# Patient Record
Sex: Female | Born: 1970 | Race: Black or African American | Hispanic: No | Marital: Single | Smoking: Current every day smoker
Health system: Southern US, Community
[De-identification: ages and names within clinical notes are randomized; demographics above are authoritative.]

## PROBLEM LIST (undated history)

## (undated) DIAGNOSIS — J40 Bronchitis, not specified as acute or chronic: Secondary | ICD-10-CM

## (undated) HISTORY — PX: ABDOMINAL HYSTERECTOMY: SHX81

---

## 2008-07-16 ENCOUNTER — Emergency Department: Payer: Self-pay | Admitting: Emergency Medicine

## 2008-11-17 ENCOUNTER — Emergency Department: Payer: Self-pay | Admitting: Emergency Medicine

## 2009-01-22 ENCOUNTER — Ambulatory Visit: Payer: Self-pay | Admitting: Internal Medicine

## 2009-01-26 ENCOUNTER — Ambulatory Visit: Payer: Self-pay | Admitting: Internal Medicine

## 2010-01-19 ENCOUNTER — Ambulatory Visit: Payer: Self-pay | Admitting: Internal Medicine

## 2010-01-28 ENCOUNTER — Emergency Department: Payer: Self-pay | Admitting: Emergency Medicine

## 2010-02-08 ENCOUNTER — Ambulatory Visit: Payer: Self-pay | Admitting: Internal Medicine

## 2010-02-08 ENCOUNTER — Ambulatory Visit: Payer: Self-pay | Admitting: Anesthesiology

## 2010-02-22 ENCOUNTER — Ambulatory Visit: Payer: Self-pay | Admitting: Internal Medicine

## 2010-04-08 HISTORY — PX: BREAST BIOPSY: SHX20

## 2010-04-19 ENCOUNTER — Ambulatory Visit: Payer: Self-pay | Admitting: Surgery

## 2010-11-04 ENCOUNTER — Ambulatory Visit: Payer: Self-pay | Admitting: Surgery

## 2010-12-02 ENCOUNTER — Emergency Department: Payer: Self-pay | Admitting: Emergency Medicine

## 2011-04-30 ENCOUNTER — Emergency Department: Payer: Self-pay | Admitting: Emergency Medicine

## 2011-09-01 ENCOUNTER — Emergency Department: Payer: Self-pay | Admitting: Emergency Medicine

## 2011-12-06 ENCOUNTER — Ambulatory Visit: Payer: Self-pay | Admitting: Obstetrics and Gynecology

## 2011-12-06 LAB — COMPREHENSIVE METABOLIC PANEL
Anion Gap: 8 (ref 7–16)
Bilirubin,Total: 0.3 mg/dL (ref 0.2–1.0)
Chloride: 109 mmol/L — ABNORMAL HIGH (ref 98–107)
Co2: 21 mmol/L (ref 21–32)
Creatinine: 0.83 mg/dL (ref 0.60–1.30)
EGFR (African American): >60
Osmolality: 274 (ref 275–301)
Potassium: 3.9 mmol/L (ref 3.5–5.1)
SGOT(AST): 28 U/L (ref 15–37)
SGPT (ALT): 99 U/L — ABNORMAL HIGH
Total Protein: 8.2 g/dL (ref 6.4–8.2)

## 2011-12-06 LAB — CBC
HCT: 36.8 % (ref 35.0–47.0)
HGB: 12.2 g/dL (ref 12.0–16.0)
MCV: 79 fL — ABNORMAL LOW (ref 80–100)
RBC: 4.64 10*6/uL (ref 3.80–5.20)
WBC: 6.9 10*3/uL (ref 3.6–11.0)

## 2011-12-06 LAB — PREGNANCY, URINE: Pregnancy Test, Urine: NEGATIVE m[IU]/mL

## 2011-12-08 ENCOUNTER — Ambulatory Visit: Payer: Self-pay | Admitting: Obstetrics and Gynecology

## 2011-12-08 LAB — CBC
HCT: 32.4 % — ABNORMAL LOW (ref 35.0–47.0)
HGB: 10.7 g/dL — ABNORMAL LOW (ref 12.0–16.0)
MCH: 25.9 pg — ABNORMAL LOW (ref 26.0–34.0)
RBC: 4.14 10*6/uL (ref 3.80–5.20)
RDW: 14.3 % (ref 11.5–14.5)

## 2011-12-08 LAB — COMPREHENSIVE METABOLIC PANEL
Anion Gap: 8 (ref 7–16)
BUN: 9 mg/dL (ref 7–18)
Bilirubin,Total: 0.4 mg/dL (ref 0.2–1.0)
Calcium, Total: 8.1 mg/dL — ABNORMAL LOW (ref 8.5–10.1)
Chloride: 106 mmol/L (ref 98–107)
Co2: 26 mmol/L (ref 21–32)
EGFR (African American): >60
EGFR (Non-African Amer.): >60
Glucose: 128 mg/dL — ABNORMAL HIGH (ref 65–99)
Osmolality: 280 (ref 275–301)
Potassium: 4.3 mmol/L (ref 3.5–5.1)
SGOT(AST): 24 U/L (ref 15–37)
SGPT (ALT): 54 U/L
Sodium: 140 mmol/L (ref 136–145)
Total Protein: 6.9 g/dL (ref 6.4–8.2)

## 2011-12-09 LAB — HEMATOCRIT: HCT: 30.5 % — ABNORMAL LOW (ref 35.0–47.0)

## 2011-12-12 LAB — PATHOLOGY REPORT

## 2011-12-29 ENCOUNTER — Emergency Department: Payer: Self-pay | Admitting: Emergency Medicine

## 2012-01-06 ENCOUNTER — Ambulatory Visit: Payer: Self-pay

## 2012-02-08 IMAGING — CR CERVICAL SPINE - COMPLETE 4+ VIEW
1 series · 7 of 7 positions shown · non-contrast
Comparison: none

REASON FOR EXAM: bilateral hand numbness/tingling neck pain
COMMENTS:

[Series 1: view not recorded · 0.17mm/px · 7 of 7 slices shown]
[im 1/7]
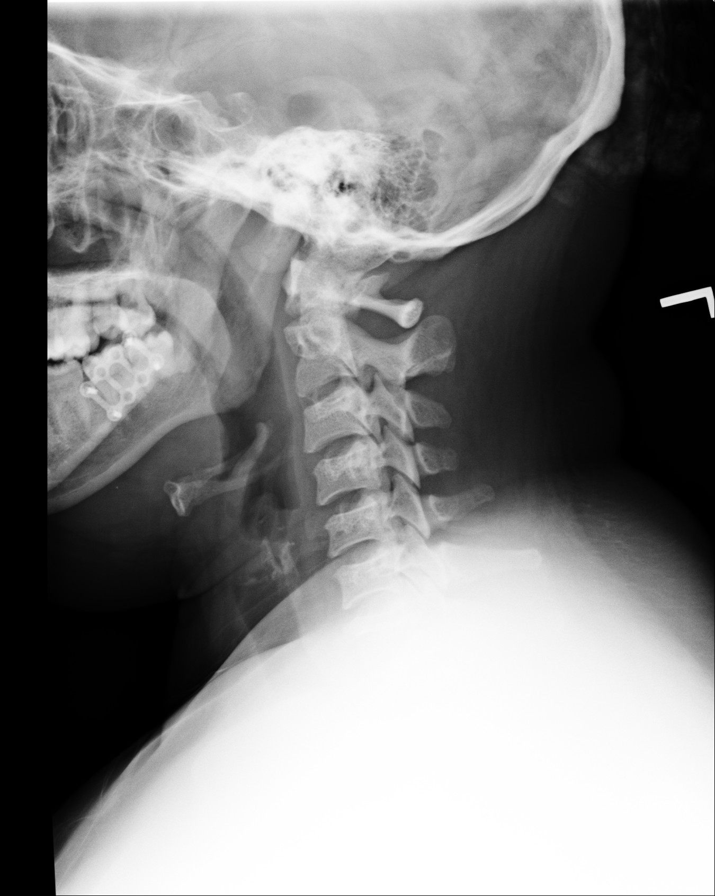
[im 2/7]
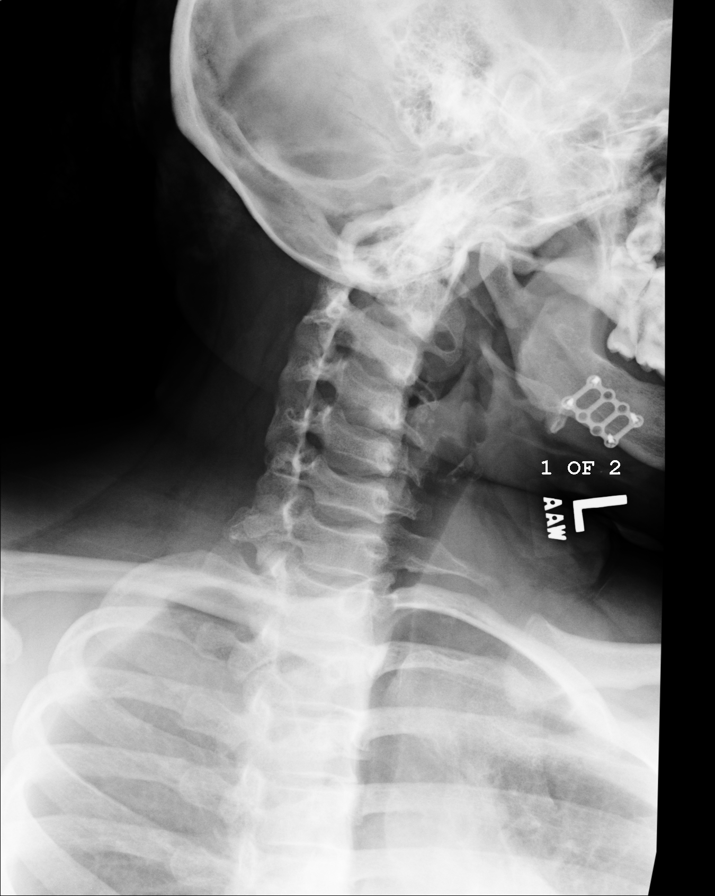
[im 3/7]
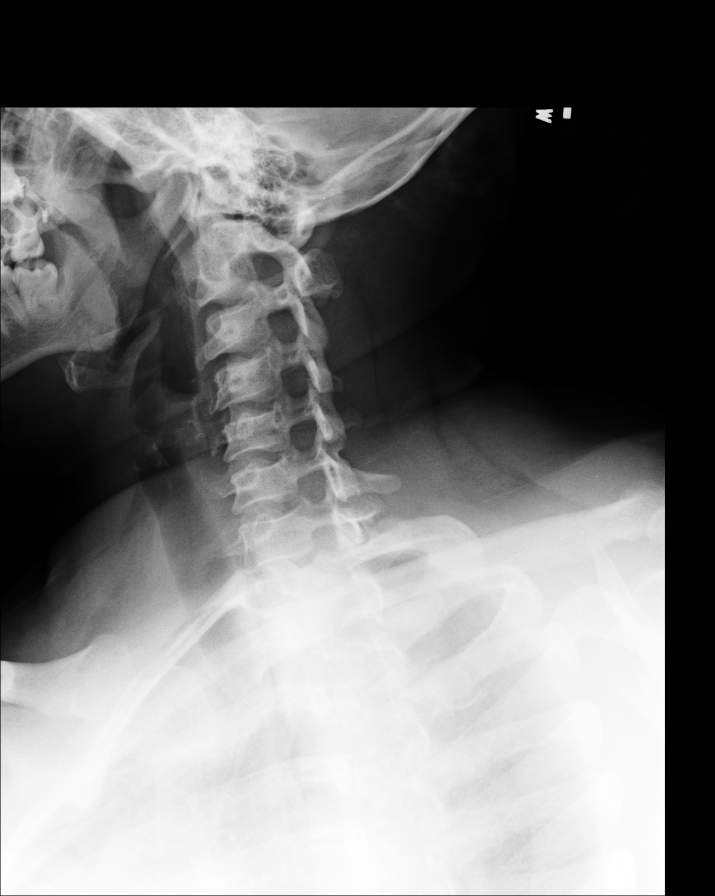
[im 4/7]
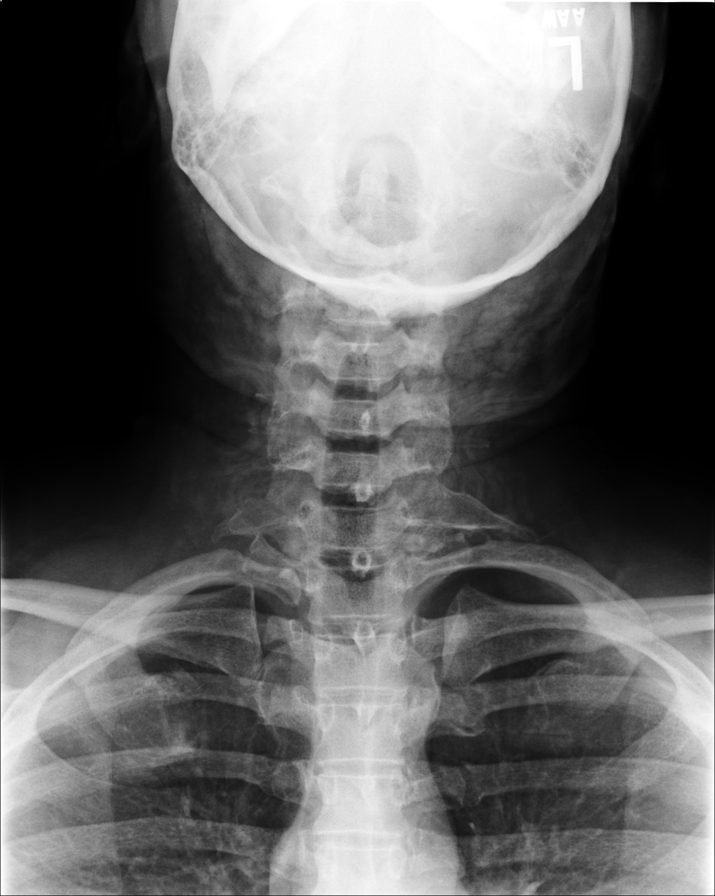
[im 5/7]
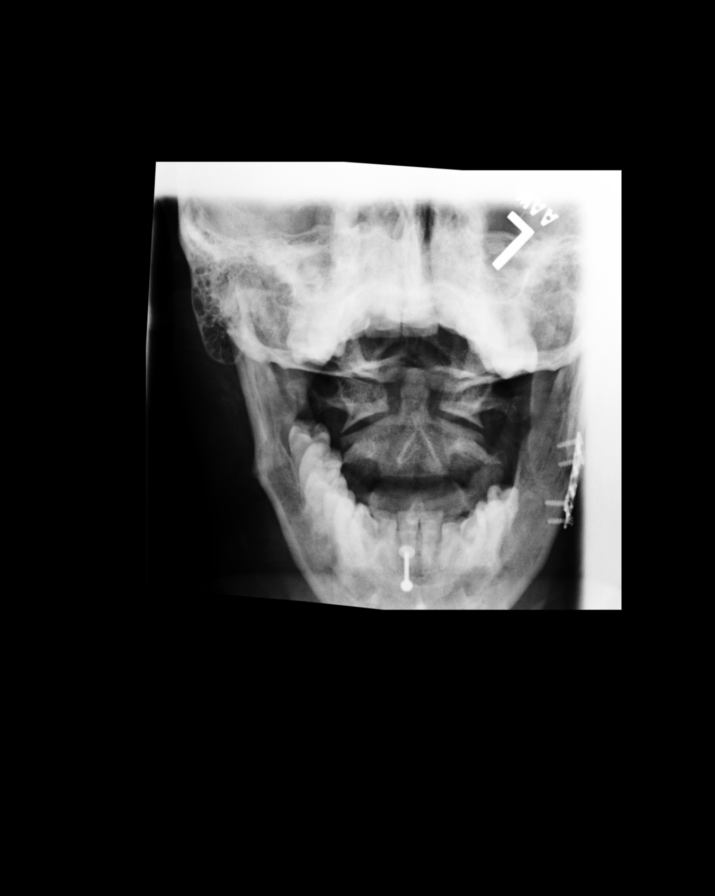
[im 6/7]
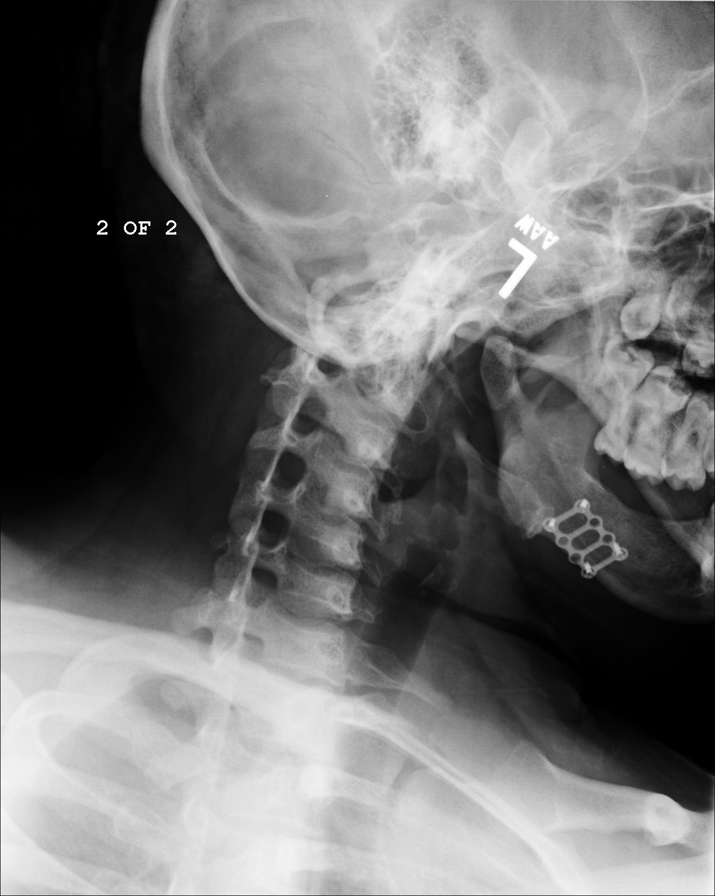
[im 7/7]
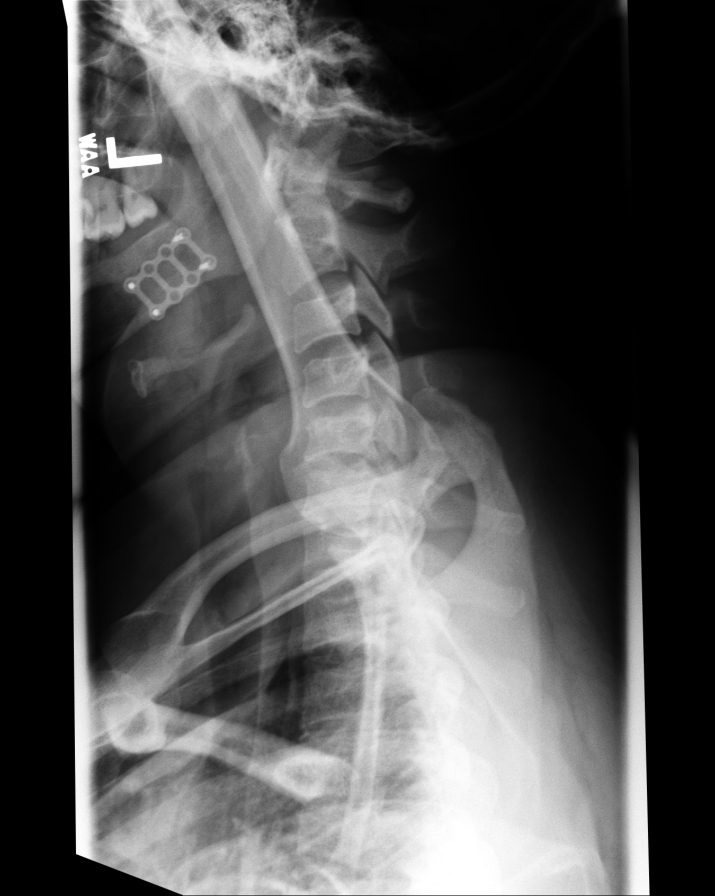

[7 of 7 positions shown; findings below may reference images not displayed]

PROCEDURE:     DXR - DXR CERVICAL SPINE COMPLETE  - January 19, 2010  [DATE]

RESULT:     The cervicothoracic region is poorly seen on the lateral view. A
swimmer's view shows grossly normal alignment. Previous mandible surgery is
present on the left with a plate and multiple screws. There is slight
tilting of the head toward the left. The foramina appear to be free of bony
encroachment. There is straightening of the normal cervical lordosis. The
prevertebral soft tissues appear normal.
IMPRESSION: 1.     There does not appear to be significant degenerative change.
2.      Straightening of the normal cervical lordosis could be secondary to
muscle spasm.

## 2012-08-28 ENCOUNTER — Emergency Department: Payer: Self-pay | Admitting: Emergency Medicine

## 2013-01-08 ENCOUNTER — Ambulatory Visit: Payer: Self-pay

## 2013-02-26 ENCOUNTER — Emergency Department: Payer: Self-pay | Admitting: Emergency Medicine

## 2013-03-08 ENCOUNTER — Emergency Department: Payer: Self-pay | Admitting: Emergency Medicine

## 2013-03-08 LAB — BASIC METABOLIC PANEL
Anion Gap: 4 — ABNORMAL LOW (ref 7–16)
BUN: 10 mg/dL (ref 7–18)
Calcium, Total: 9.2 mg/dL (ref 8.5–10.1)
Chloride: 107 mmol/L (ref 98–107)
Co2: 27 mmol/L (ref 21–32)
EGFR (African American): >60
EGFR (Non-African Amer.): >60
Glucose: 146 mg/dL — ABNORMAL HIGH (ref 65–99)
Osmolality: 277 (ref 275–301)
Potassium: 3.6 mmol/L (ref 3.5–5.1)

## 2013-03-08 LAB — CBC
HCT: 40 % (ref 35.0–47.0)
MCH: 26.6 pg (ref 26.0–34.0)
MCV: 81 fL (ref 80–100)
RBC: 4.96 10*6/uL (ref 3.80–5.20)
RDW: 14.4 % (ref 11.5–14.5)
WBC: 8.4 10*3/uL (ref 3.6–11.0)

## 2013-04-09 ENCOUNTER — Emergency Department: Payer: Self-pay | Admitting: Emergency Medicine

## 2013-09-11 ENCOUNTER — Ambulatory Visit: Payer: Self-pay | Admitting: Family Medicine

## 2014-01-09 ENCOUNTER — Ambulatory Visit: Payer: Self-pay | Admitting: Family Medicine

## 2014-02-06 ENCOUNTER — Ambulatory Visit: Payer: Self-pay | Admitting: Family Medicine

## 2014-07-31 ENCOUNTER — Emergency Department: Payer: Self-pay | Admitting: Emergency Medicine

## 2014-07-31 LAB — BASIC METABOLIC PANEL
ANION GAP: 10 (ref 7–16)
BUN: 19 mg/dL
CREATININE: 0.83 mg/dL
Calcium, Total: 8.5 mg/dL — ABNORMAL LOW
Chloride: 105 mmol/L
Co2: 23 mmol/L
EGFR (African American): >60
GLUCOSE: 98 mg/dL
Potassium: 4 mmol/L
Sodium: 138 mmol/L

## 2014-07-31 LAB — CBC
HCT: 41.5 % (ref 35.0–47.0)
HGB: 13.3 g/dL (ref 12.0–16.0)
MCH: 26 pg (ref 26.0–34.0)
MCHC: 32.1 g/dL (ref 32.0–36.0)
MCV: 81 fL (ref 80–100)
Platelet: 297 10*3/uL (ref 150–440)
RBC: 5.12 10*6/uL (ref 3.80–5.20)
RDW: 15 % — ABNORMAL HIGH (ref 11.5–14.5)
WBC: 11.3 10*3/uL — ABNORMAL HIGH (ref 3.6–11.0)

## 2014-07-31 LAB — TROPONIN I

## 2014-07-31 LAB — PRO B NATRIURETIC PEPTIDE: B-Type Natriuretic Peptide: 16 pg/mL

## 2014-08-26 NOTE — Op Note (Signed)
PATIENT NAME:  Gabriella Coleman, Gabriella Coleman MR#:  [id number] DATE OF BIRTH:  1970-10-25  DATE OF PROCEDURE:  12/08/2011  PREOPERATIVE DIAGNOSES:  1. Abnormal uterine bleeding.  2. Leiomyomatous uterus.   POSTOPERATIVE DIAGNOSES: 1. Abnormal uterine bleeding.  2. Leiomyomatous uterus.   PROCEDURES:  1. Total laparoscopic hysterectomy.  2. Bilateral salpingectomy.  3. Cystoscopy.   SURGEON: Will Bonnet, MD   ASSISTANT SURGEON: Malachy Mood, MD    ESTIMATED BLOOD LOSS: 200 mL.  OPERATIVE FLUIDS: 2400 mL Crystalloid.   COMPLICATIONS: None.   FINDINGS:  1. Large fibroid uterus with ovaries densely adherent to the uterus, fallopian tubes, and bilateral sidewalls.  2. Several ovarian simple cysts in the right ovary.  3. Bilateral efflux from the ureters on cystoscopy.   SPECIMENS SENT: Uterus with cervix along with right tube and left tube to pathology for permanent.   INDICATIONS FOR PROCEDURE: Ms. Camus is a 44 year old female who was seen in the office for heavy painful bleeding. Upon work-up it was noted that she had a large fibroid which was thought to be submucosal as well as several other fibroids. She was offered different modalities of potential treatment for this, however, ultimately she wanted to undergo hysterectomy for management. Also after discussing the merits versus risks and benefits of removal of fallopian tubes, she elected to have her fallopian tubes removed as part of the surgery. She was, therefore, taken to the operating room.   PROCEDURE IN DETAIL: The patient was met in the preoperative area and the procedure was reviewed with her in detail and all of her questions were answered. She was taken to the operating room and placed under general anesthesia which was found to be adequate. She was placed in the dorsal supine lithotomy position.  After careful attention was paid to positioning to ensure no nerve damage would occur during surgery she was prepped and draped in the  usual sterile fashion. After time-out was called, a Foley catheter was placed in her bladder and a sterile speculum was placed in her vagina. A single-tooth tenaculum was used to grasp the anterior lip of her cervix. After sounding her uterus to 9 cm, a V-care device was then attached to her cervix with the balloon inflated and the cervical ring flush against the entire fornix area.   Attention was turned to the abdomen where a 5 mm infraumbilical incision was made after injecting local anesthesia. Entry into the abdomen was gained using the Optiview trocar system. Abdominal entry was verified using the opening pressure which was found to be normal. After insufflation with CO2, the camera was introduced through the umbilical port site and atraumatic entry was verified. Under direct intraabdominal camera visualization in the right lower quadrant, a 10 mm port was placed and a left lower quadrant 5 mm port was placed. An abdominal survey was undertaken with the above-noted findings.   Attention was turned to the right adnexal area where initially the right ureter was identified and found to be well away from the operative area of interest. The round ligament was then identified and transected using the Harmonic scalpel and entry into the retroperitoneal space was gained. However, there was a large amount of adhesive tissue and the broad ligament was able to be opened anteriorly and the beginning of the bladder flap was created. Next, the fallopian tube was then removed independently starting from the medial position to lateral. Adhesions were then carefully removed on the right adnexa and the uteroovarian ligament was identified and transected  with electrocautery. This was followed down the right broad ligament until the uterine artery could be found and skeletonized. Next, the same procedure was carried out on the left side after identification of the left ureter with removal of the left fallopian tube first from  medial to lateral fashion. Again, careful dissection had to be undertaken in order to safely remove the scar tissue and adhesions from the ovary to the left side of the uterus. The broad ligament was transected down to the level of the uterine artery which was skeletonized and ultimately the uterine artery was skeletonized bilaterally.   After electrocauterization of both uterine arteries was adequate, the bladder flap was then carefully created and the colpotomy was made using the Harmonic scalpel with careful attention to applying pressure on the cervical fornix rings of the V-care device in order to move the colpotomy away from the path of the ureters. Colpotomy was accomplished and the entire cervix was detached from the vagina in this fashion. Given the size of the uterus versus the patient's vaginal opening, it was decided that an intraabdominal morcellation procedure would be needed to remove the uterus from the abdominal cavity. The uterus was then moved to the upper abdominal cavity for closure of the vaginal cuff.   Vaginal cuff was closed using #0 Vicryl using interrupted stitches. The integrity of the vaginal closure was verified using a gloved hand in the vagina pushing against the vaginal cuff. There were no openings nor leakage of air. The vaginal cuff closure was affected using the EndoStitch device for the stitches and extracorporeal knot tying with knot pusher to push down the knots. Next, the right lower quadrant port was replaced with the Gynecare morcellator port. The uterus was then removed from the abdominal cavity carefully using the morcellator with careful attention to remove any visible remaining pieces of uterus and cervix. Next, careful lavage was undertaken of the abdomen to further identify uterine pieces as well as to verify hemostasis. Uterine pieces were removed and hemostasis was verified. The right lower quadrant port was then removed and the right lower quadrant fascia was  closed using a fascial closure device and the fascia was reapproximated using a 0 vicryl stitch. The skin was closed using 4-0 Monocryl in a subcuticular fashion.   Cystoscopy was next undertaken to verify function of bilateral ureters. Indigo carmine was injected by the anesthesiologist. The 70 degree cystoscope was gently introduced through the urethra after removal of Foley catheter. The bladder was then filled with approximately 150 mL of normal sterile saline. The dome of the bladder was identified with no defects as well as the trigone and lateral walls. The bladder was noted to be totally intact. Both ureteral orifices were easily identified and bilateral efflux was noted of indigo carmine from the ureteral orifices. At the end of the procedure the Foley catheter was then replaced.   Attention was returned to the abdomen where each port site was carefully removed after desufflation of abdomen of CO2. A single 4-0 Monocryl stitch was placed in each of the 5 mm port sites and the skin was reapproximated at all three port sites using Dermabond.   The patient tolerated the procedure well. Sponge, lap, and needle counts were correct x2. The patient received antibiotic prophylaxis in the form of Ancef 2 grams prior to the procedure and during the procedure as the procedure took about four hours to accomplish. She received VTE prophylaxis in the form of SCDs which were in place and  operating throughout the entire procedure.    The patient was awakened in the operating room and transported to the recovery area in stable condition.   ____________________________ Will Bonnet, MD sdj:drc D: 12/08/2011 18:28:40 ET T: 12/09/2011 10:25:49 ET JOB#: [id number]  cc: Will Bonnet, MD, <Dictator> Will Bonnet MD ELECTRONICALLY SIGNED 12/15/2011 7:58

## 2014-09-23 ENCOUNTER — Encounter: Payer: Self-pay | Admitting: Emergency Medicine

## 2014-09-23 ENCOUNTER — Emergency Department: Payer: Medicaid Other

## 2014-09-23 ENCOUNTER — Emergency Department
Admission: EM | Admit: 2014-09-23 | Discharge: 2014-09-23 | Disposition: A | Discharge status: Home / Self Care | Payer: Medicaid Other | Attending: Emergency Medicine | Admitting: Emergency Medicine

## 2014-09-23 DIAGNOSIS — Z720 Tobacco use: Secondary | ICD-10-CM | POA: Insufficient documentation

## 2014-09-23 DIAGNOSIS — J209 Acute bronchitis, unspecified: Secondary | ICD-10-CM | POA: Insufficient documentation

## 2014-09-23 DIAGNOSIS — R0789 Other chest pain: Secondary | ICD-10-CM | POA: Insufficient documentation

## 2014-09-23 DIAGNOSIS — J029 Acute pharyngitis, unspecified: Secondary | ICD-10-CM | POA: Diagnosis present

## 2014-09-23 HISTORY — DX: Bronchitis, not specified as acute or chronic: J40

## 2014-09-23 MED ORDER — PROMETHAZINE-CODEINE 6.25-10 MG/5ML PO SYRP: 10.0000 mL | ORAL_SOLUTION | Freq: Three times a day (TID) | ORAL | Status: DC | PRN

## 2014-09-23 MED ORDER — HYDROCOD POLST-CPM POLST ER 10-8 MG/5ML PO SUER
5.0000 mL | Freq: Once | ORAL | Status: AC
  Administered 2014-09-23: 5 mL via ORAL

## 2014-09-23 MED ORDER — PREDNISONE 20 MG PO TABS
ORAL_TABLET | ORAL | Status: AC
  Filled 2014-09-23: qty 3

## 2014-09-23 MED ORDER — AZITHROMYCIN 250 MG PO TABS: ORAL_TABLET | ORAL | Status: DC

## 2014-09-23 MED ORDER — PREDNISONE 20 MG PO TABS
60.0000 mg | ORAL_TABLET | Freq: Once | ORAL | Status: AC
  Administered 2014-09-23: 60 mg via ORAL

## 2014-09-23 MED ORDER — ALBUTEROL SULFATE HFA 108 (90 BASE) MCG/ACT IN AERS: 2.0000 | INHALATION_SPRAY | Freq: Four times a day (QID) | RESPIRATORY_TRACT | Status: DC | PRN

## 2014-09-23 MED ORDER — PREDNISONE 10 MG PO TABS: 50.0000 mg | ORAL_TABLET | Freq: Every day | ORAL | Status: DC

## 2014-09-23 MED ORDER — AZITHROMYCIN 250 MG PO TABS
ORAL_TABLET | ORAL | Status: AC
  Administered 2014-09-23: 500 mg via ORAL
  Filled 2014-09-23: qty 2

## 2014-09-23 MED ORDER — IPRATROPIUM-ALBUTEROL 0.5-2.5 (3) MG/3ML IN SOLN
3.0000 mL | Freq: Once | RESPIRATORY_TRACT | Status: AC
  Administered 2014-09-23: 3 mL via RESPIRATORY_TRACT

## 2014-09-23 MED ORDER — PROMETHAZINE-CODEINE 6.25-10 MG/5ML PO SYRP: 5.0000 mL | ORAL_SOLUTION | Freq: Four times a day (QID) | ORAL | Status: DC | PRN

## 2014-09-23 MED ORDER — HYDROCOD POLST-CPM POLST ER 10-8 MG/5ML PO SUER
ORAL | Status: AC
  Filled 2014-09-23: qty 5

## 2014-09-23 MED ORDER — IPRATROPIUM-ALBUTEROL 0.5-2.5 (3) MG/3ML IN SOLN
RESPIRATORY_TRACT | Status: AC
  Administered 2014-09-23: 3 mL via RESPIRATORY_TRACT
  Filled 2014-09-23: qty 3

## 2014-09-23 MED ORDER — AZITHROMYCIN 250 MG PO TABS
500.0000 mg | ORAL_TABLET | Freq: Once | ORAL | Status: AC
  Administered 2014-09-23: 500 mg via ORAL

## 2014-09-23 NOTE — ED Notes (Signed)
Pt to ed with c/o sore throat, cough, congestion, earache since Thursday.  Reports hx of bronchitis, +smoker.

## 2014-09-23 NOTE — ED Provider Notes (Signed)
[facility] Emergency Department Provider Note  ____________________________________________  Time seen: Approximately 8:38 PM  I have reviewed the triage vital signs and the nursing notes.   HISTORY  Chief Complaint Cough; Nasal Congestion; and Sore Throat    HPI Gabriella Coleman is a 44 y.o. female who presents with about a seven-Urenda history of sinus pain and cough which has progressed to sore throat. No relief with over-the-counter medications. She is a smoker but has not had any cigarettes in the past 3 days. Her cough is much worse at night when trying to lie down and sleep.   Past Medical History  Diagnosis Date  . Bronchitis     There are no active problems to display for this patient.   Past Surgical History  Procedure Laterality Date  . Abdominal hysterectomy      Current Outpatient Rx  Name  Route  Sig  Dispense  Refill  . albuterol (PROVENTIL HFA;VENTOLIN HFA) 108 (90 BASE) MCG/ACT inhaler   Inhalation   Inhale 2 puffs into the lungs every 6 (six) hours as needed for wheezing or shortness of breath.   1 Inhaler   2   . azithromycin (ZITHROMAX Z-PAK) 250 MG tablet      Take 2 tablets (500 mg) on  [place] 1,  followed by 1 tablet (250 mg) once daily on Days 2 through 5.   6 each   0   . predniSONE (DELTASONE) 10 MG tablet   Oral   Take 5 tablets (50 mg total) by mouth daily.   25 tablet   0   . promethazine-codeine (PHENERGAN WITH CODEINE) 6.25-10 MG/5ML syrup   Oral   Take 10 mLs by mouth every 8 (eight) hours as needed for cough.   120 mL   0     Allergies Percocet  No family history on file.  Social History History  Substance Use Topics  . Smoking status: Current [employer] [place] Smoker  . Smokeless tobacco: Not on file  . Alcohol Use: Yes    Review of Systems Constitutional: No fever/chills Eyes: No visual changes. ENT:  sore throat and sinus pain. Cardiovascular: Denies chest pain. Respiratory: Denies shortness of  breath. Gastrointestinal: No abdominal pain.  No nausea, no vomiting.  No diarrhea.  No constipation. Genitourinary: Negative for dysuria. Musculoskeletal: Negative for back pain. Chest is sore due to cough. Skin: Negative for rash. Neurological: Negative for headaches, focal weakness or numbness.  10-point ROS otherwise negative.  ____________________________________________   PHYSICAL EXAM:  VITAL SIGNS: ED Triage Vitals  Enc Vitals Group     BP 09/23/14 1738 141/88 mmHg     Pulse Rate 09/23/14 1738 83     Resp 09/23/14 1738 20     Temp 09/23/14 1738 98.1 F (36.7 C)     Temp Source 09/23/14 1738 Oral     SpO2 09/23/14 1738 100 %     Weight 09/23/14 1738 287 lb (130.182 kg)     Height 09/23/14 1738 5\' 6"  (1.676 m)     Head Cir --      Peak Flow --      Pain Score 09/23/14 1739 9     Pain Loc --      Pain Edu? --      Excl. in GC? --     Constitutional: Alert and oriented. Well appearing and in no acute distress. Frequent dry cough observed. Eyes: Conjunctivae are normal. PERRL. EOMI. Head: Atraumatic. Nose: Maxillary and ethmoid sinus tenderness  bilateral with mucosal edema.. Mouth/Throat: Mucous membranes are moist.  Oropharynx mildly erythematous Neck: No stridor.  Hematological/Lymphatic/Immunilogical: No cervical lymphadenopathy. Cardiovascular: Normal rate, regular rhythm. Grossly normal heart sounds.  Good peripheral circulation. Respiratory: Normal respiratory effort.  No retractions. Decreased breath sounds throughout. Scant expiratory wheezes bilateral bases. Gastrointestinal: Soft and nontender. . Musculoskeletal: No lower extremity tenderness nor edema.  No joint effusions. Chest wall tender to palpation Neurologic:  Normal speech and language. No gross focal neurologic deficits are appreciated. Speech is normal. No gait instability. Skin:  Skin is warm, dry and intact. No rash noted. Psychiatric: Mood and affect are normal. Speech and behavior are  normal.  ____________________________________________   LABS (all labs ordered are listed, but only abnormal results are displayed)  Labs Reviewed - No data to display ____________________________________________  EKG   ____________________________________________  RADIOLOGY  Chest x-ray negative for infiltrate or opacity. ____________________________________________   PROCEDURES  Procedure(s) performed: None  Critical Care performed: No  ____________________________________________   INITIAL IMPRESSION / ASSESSMENT AND PLAN / ED COURSE  Pertinent labs & imaging results that were available during my care of the patient were reviewed by me and considered in my medical decision making (see chart for details).  Some improvement post DuoNeb treatment in the emergency department. She'll be discharged home on prednisone and azithromycin cough medicine and an albuterol inhaler. She was advised to follow-up with a primary care provider of her choice for symptoms that are not improving over the next 2-3 days or return to the emergency department for symptoms that change or worsen. ____________________________________________   FINAL CLINICAL IMPRESSION(S) / ED DIAGNOSES  Final diagnoses:  Bronchitis, acute, with bronchospasm      Chinita PesterCari B Frederika Hukill, FNP 09/23/14 [id number]  Phineas SemenGraydon Goodman, MD 09/23/14 2240

## 2014-09-23 NOTE — ED Notes (Signed)
Pt here with cough and congestion, pt states that she has bronchitis, pt has a hx of the same. Pt states that she has a productive cough. Pt states that she has had fever, chills. Pt states that she is having chest pain from the cough. Pt in NAD at this time will cont to monitor pt at all times.

## 2014-09-23 NOTE — Discharge Instructions (Signed)

## 2014-09-23 NOTE — ED Notes (Signed)
Called pharmacy to bring the medication up.

## 2016-01-18 ENCOUNTER — Encounter: Payer: Self-pay | Admitting: Emergency Medicine

## 2016-01-18 ENCOUNTER — Emergency Department: Payer: Medicaid Other

## 2016-01-18 ENCOUNTER — Emergency Department
Admission: EM | Admit: 2016-01-18 | Discharge: 2016-01-19 | Disposition: A | Discharge status: Home / Self Care | Payer: Medicaid Other | Attending: Emergency Medicine | Admitting: Emergency Medicine

## 2016-01-18 DIAGNOSIS — I6602 Occlusion and stenosis of left middle cerebral artery: Secondary | ICD-10-CM | POA: Insufficient documentation

## 2016-01-18 DIAGNOSIS — F17200 Nicotine dependence, unspecified, uncomplicated: Secondary | ICD-10-CM | POA: Insufficient documentation

## 2016-01-18 DIAGNOSIS — R42 Dizziness and giddiness: Secondary | ICD-10-CM | POA: Diagnosis not present

## 2016-01-18 LAB — BASIC METABOLIC PANEL
ANION GAP: 8 (ref 5–15)
BUN: 11 mg/dL (ref 6–20)
CO2: 23 mmol/L (ref 22–32)
Calcium: 9.1 mg/dL (ref 8.9–10.3)
Chloride: 105 mmol/L (ref 101–111)
Creatinine, Ser: 0.88 mg/dL (ref 0.44–1.00)
GFR calc non Af Amer: >60 mL/min (ref >60)
Glucose, Bld: 99 mg/dL (ref 65–99)
Potassium: 3.7 mmol/L (ref 3.5–5.1)
Sodium: 136 mmol/L (ref 135–145)

## 2016-01-18 LAB — CBC
HCT: 42.3 % (ref 35.0–47.0)
HEMOGLOBIN: 14.4 g/dL (ref 12.0–16.0)
MCH: 26.5 pg (ref 26.0–34.0)
MCHC: 33.9 g/dL (ref 32.0–36.0)
MCV: 78.1 fL — ABNORMAL LOW (ref 80.0–100.0)
Platelets: 287 10*3/uL (ref 150–440)
RBC: 5.42 MIL/uL — AB (ref 3.80–5.20)
RDW: 14.8 % — ABNORMAL HIGH (ref 11.5–14.5)
WBC: 7.6 10*3/uL (ref 3.6–11.0)

## 2016-01-18 LAB — URINALYSIS COMPLETE WITH MICROSCOPIC (ARMC ONLY)
Bacteria, UA: NONE SEEN
Bilirubin Urine: NEGATIVE
Glucose, UA: NEGATIVE mg/dL
KETONES UR: NEGATIVE mg/dL
Leukocytes, UA: NEGATIVE
Nitrite: NEGATIVE
PH: 5 (ref 5.0–8.0)
PROTEIN: 30 mg/dL — AB
SPECIFIC GRAVITY, URINE: 1.018 (ref 1.005–1.030)

## 2016-01-18 LAB — GLUCOSE, CAPILLARY: Glucose-Capillary: 95 mg/dL (ref 65–99)

## 2016-01-18 MED ORDER — MECLIZINE HCL 25 MG PO TABS
25.0000 mg | ORAL_TABLET | Freq: Once | ORAL | Status: AC
  Administered 2016-01-18: 25 mg via ORAL
  Filled 2016-01-18: qty 1

## 2016-01-18 NOTE — ED Provider Notes (Signed)
[facility] Emergency Department Provider Note    First MD Initiated Contact with Patient 01/18/16 1105     (approximate)  I have reviewed the triage vital signs and the nursing notes.   HISTORY  Chief Complaint Dizziness   HPI Gabriella Coleman is a 45 y.o. female presents with three-Krohn history of dizziness left-sided headache with left facial numbness. Patient also admits to unsteady "bouncing" vision intermittently as well. Patient denies any recent illness, no congestion cough or fever. Patient does admit to frequent sneezing over the past few days. Patient denies any personal family history of aneurysms.   Past Medical History:  Diagnosis Date  . Bronchitis     There are no active problems to display for this patient.   Past Surgical History:  Procedure Laterality Date  . ABDOMINAL HYSTERECTOMY      Prior to Admission medications   Medication Sig Start Date End Date Taking? Authorizing Provider  albuterol (PROVENTIL HFA;VENTOLIN HFA) 108 (90 BASE) MCG/ACT inhaler Inhale 2 puffs into the lungs every 6 (six) hours as needed for wheezing or shortness of breath. 09/23/14   Chinita Pester, FNP  azithromycin (ZITHROMAX Z-PAK) 250 MG tablet Take 2 tablets (500 mg) on  [place] 1,  followed by 1 tablet (250 mg) once daily on Days 2 through 5. 09/23/14   Cari B Triplett, FNP  meclizine (ANTIVERT) 32 MG tablet Take 1 tablet (32 mg total) by mouth 3 (three) times daily as needed. 01/19/16   Darci Current, MD  predniSONE (DELTASONE) 10 MG tablet Take 5 tablets (50 mg total) by mouth daily. 09/23/14   Chinita Pester, FNP  promethazine-codeine (PHENERGAN WITH CODEINE) 6.25-10 MG/5ML syrup Take 10 mLs by mouth every 8 (eight) hours as needed for cough. 09/23/14   Chinita Pester, FNP    Allergies Percocet [oxycodone-acetaminophen]  No family history on file.  Social History Social History  Substance Use Topics  . Smoking status: Current Every Butters Smoker  .  Smokeless tobacco: Never Used  . Alcohol use Yes    Review of Systems Constitutional: No fever/chills Eyes: No visual changes. ENT: No sore throat. Cardiovascular: Denies chest pain. Respiratory: Denies shortness of breath. Gastrointestinal: No abdominal pain.  No nausea, no vomiting.  No diarrhea.  No constipation. Genitourinary: Negative for dysuria. Musculoskeletal: Negative for back pain. Skin: Negative for rash. Neurological: Negative for headaches, focal weakness or numbness. Positive for dizziness  10-point ROS otherwise negative.  ____________________________________________   PHYSICAL EXAM:  VITAL SIGNS: ED Triage Vitals  Enc Vitals Group     BP 01/18/16 2148 (!) 143/80     Pulse Rate 01/18/16 2148 64     Resp 01/18/16 2148 18     Temp 01/18/16 2148 97.7 F (36.5 C)     Temp Source 01/18/16 2148 Oral     SpO2 01/18/16 2148 98 %     Weight 01/18/16 2148 230 lb (104.3 kg)     Height 01/18/16 2148 5\' 6"  (1.676 m)     Head Circumference --      Peak Flow --      Pain Score 01/18/16 2149 8     Pain Loc --      Pain Edu? --      Excl. in GC? --     Constitutional: Alert and oriented. Well appearing and in no acute distress. Eyes: Conjunctivae are normal. PERRL. EOMI.Positive for horizontal nystagmus Head: Atraumatic. Ears:  Healthy appearing ear canals and TMs bilaterally  Nose: No congestion/rhinnorhea. Mouth/Throat: Mucous membranes are moist.  Oropharynx non-erythematous. Neck: No stridor.  No meningeal signs. Cardiovascular: Normal rate, regular rhythm. Good peripheral circulation. Grossly normal heart sounds. Respiratory: Normal respiratory effort.  No retractions. Lungs CTAB. Gastrointestinal: Soft and nontender. No distention.  Musculoskeletal: No lower extremity tenderness nor edema. No gross deformities of extremities. Neurologic:  Normal speech and language. No gross focal neurologic deficits are appreciated.  Skin:  Skin is warm, dry and intact. No  rash noted. Psychiatric: Mood and affect are normal. Speech and behavior are normal.  ____________________________________________   LABS (all labs ordered are listed, but only abnormal results are displayed)  Labs Reviewed  CBC - Abnormal; Notable for the following:       Result Value   RBC 5.42 (*)    MCV 78.1 (*)    RDW 14.8 (*)    All other components within normal limits  URINALYSIS COMPLETEWITH MICROSCOPIC ([facility] ONLY) - Abnormal; Notable for the following:    Color, Urine YELLOW (*)    APPearance CLEAR (*)    Hgb urine dipstick 2+ (*)    Protein, ur 30 (*)    Squamous Epithelial / LPF 0-5 (*)    All other components within normal limits  BASIC METABOLIC PANEL  GLUCOSE, CAPILLARY  CBG MONITORING, ED   ____________________________________________  EKG  ED ECG REPORT I, Cherry N BROWN, the attending physician, personally viewed and interpreted this ECG.   Date: 01/18/2016  EKG Time: 9:56 PM  Rate: 56  Rhythm: Sinus bradycardia  Axis:Normal  Intervals:Normal  ST&T Change: None  ____________________________________________  RADIOLOGY I, [place] N BROWN, personally viewed and evaluated these images (plain radiographs) as part of my medical decision making, as well as reviewing the written report by the radiologist.  Mr Angiogram Head Wo Contrast  Result Date: 01/19/2016 CLINICAL DATA:  Dizziness, nystagmus, nausea EXAM: MRI HEAD WITHOUT CONTRAST MRA HEAD WITHOUT CONTRAST TECHNIQUE: Multiplanar, multiecho pulse sequences of the brain and surrounding structures were obtained without intravenous contrast. Angiographic images of the head were obtained using MRA technique without contrast. COMPARISON:  Head CT 11/18/2008 FINDINGS: MRI HEAD FINDINGS Brain: No acute infarct or intraparenchymal hemorrhage. The midline structures are normal. No focal parenchymal signal abnormality. No mass lesion or midline shift. No hydrocephalus or extra-axial fluid collection. Vascular:  Major intracranial flow voids are preserved. No evidence of chronic microhemorrhage or amyloid angiopathy. Skull and upper cervical spine: The visualized skull base, calvarium, upper cervical spine and extracranial soft tissues are normal. Sinuses/Orbits: No fluid levels or advanced mucosal thickening. No mastoid effusion. Normal orbits. MRA HEAD FINDINGS Intracranial internal carotid arteries: Normal. Anterior cerebral arteries: Normal. Middle cerebral arteries: There is mild narrowing of the inferior division M2 segment on the left. Normal right MCA. Posterior communicating arteries: Present bilaterally. Posterior cerebral arteries: Normal. Fetal predominant on the right. Basilar artery: Normal. Vertebral arteries: Left dominant. Normal. Superior cerebellar arteries: Normal. Anterior inferior cerebellar arteries: Normal. Posterior inferior cerebellar arteries: Normal. IMPRESSION: 1. Normal MRI of the brain. 2. No occlusion or high-grade stenosis of the intracranial arteries. Electronically Signed   By: Deatra Robinson M.D.   On: 01/19/2016 01:37   Mr Brain Wo Contrast  Result Date: 01/19/2016 CLINICAL DATA:  Dizziness, nystagmus, nausea EXAM: MRI HEAD WITHOUT CONTRAST MRA HEAD WITHOUT CONTRAST TECHNIQUE: Multiplanar, multiecho pulse sequences of the brain and surrounding structures were obtained without intravenous contrast. Angiographic images of the head were obtained using MRA technique without contrast. COMPARISON:  Head CT 11/18/2008 FINDINGS:  MRI HEAD FINDINGS Brain: No acute infarct or intraparenchymal hemorrhage. The midline structures are normal. No focal parenchymal signal abnormality. No mass lesion or midline shift. No hydrocephalus or extra-axial fluid collection. Vascular: Major intracranial flow voids are preserved. No evidence of chronic microhemorrhage or amyloid angiopathy. Skull and upper cervical spine: The visualized skull base, calvarium, upper cervical spine and extracranial soft tissues  are normal. Sinuses/Orbits: No fluid levels or advanced mucosal thickening. No mastoid effusion. Normal orbits. MRA HEAD FINDINGS Intracranial internal carotid arteries: Normal. Anterior cerebral arteries: Normal. Middle cerebral arteries: There is mild narrowing of the inferior division M2 segment on the left. Normal right MCA. Posterior communicating arteries: Present bilaterally. Posterior cerebral arteries: Normal. Fetal predominant on the right. Basilar artery: Normal. Vertebral arteries: Left dominant. Normal. Superior cerebellar arteries: Normal. Anterior inferior cerebellar arteries: Normal. Posterior inferior cerebellar arteries: Normal. IMPRESSION: 1. Normal MRI of the brain. 2. No occlusion or high-grade stenosis of the intracranial arteries. Electronically Signed   By: Deatra RobinsonKevin  Herman M.D.   On: 01/19/2016 01:37    ____________________________________________   PROCEDURES  Procedure(s) performed:   Procedures     INITIAL IMPRESSION / ASSESSMENT AND PLAN / ED COURSE  Pertinent labs & imaging results that were available during my care of the patient were reviewed by me and considered in my medical decision making (see chart for details).  Patient given meclizine with improvement of symptoms MRI revealed "normal brain". Patient referred to ENT   Clinical Course    ____________________________________________  FINAL CLINICAL IMPRESSION(S) / ED DIAGNOSES  Final diagnoses:  Vertigo     MEDICATIONS GIVEN DURING THIS VISIT:  Medications  meclizine (ANTIVERT) tablet 25 mg (25 mg Oral Given 01/18/16 2324)  LORazepam (ATIVAN) tablet 1 mg (1 mg Oral Given 01/19/16 0015)  ondansetron (ZOFRAN) tablet 4 mg (4 mg Oral Given 01/19/16 0030)     NEW OUTPATIENT MEDICATIONS STARTED DURING THIS VISIT:  New Prescriptions   MECLIZINE (ANTIVERT) 32 MG TABLET    Take 1 tablet (32 mg total) by mouth 3 (three) times daily as needed.    Modified Medications   No medications on file     Discontinued Medications   No medications on file     Note:  This document was prepared using Dragon voice recognition software and may include unintentional dictation errors.    Darci Currentandolph N Brown, MD 01/19/16 [phone]

## 2016-01-18 NOTE — ED Triage Notes (Signed)
Pt presents to ED with c/o dizziness and left sided head pressure x 3 days, reports this morning began to have tingling sensation to left side of face. Pt denise chest pain, abdominal pain, pr shortness of breath. Pt alert and oriented x 4, no increased work in breathing noted, skin warm and dry.

## 2016-01-19 MED ORDER — LORAZEPAM 1 MG PO TABS
ORAL_TABLET | ORAL | Status: AC
  Administered 2016-01-19: 1 mg via ORAL
  Filled 2016-01-19: qty 1

## 2016-01-19 MED ORDER — MECLIZINE HCL 32 MG PO TABS: 32.0000 mg | ORAL_TABLET | Freq: Three times a day (TID) | ORAL | 0 refills | Status: DC | PRN

## 2016-01-19 MED ORDER — LORAZEPAM 1 MG PO TABS
1.0000 mg | ORAL_TABLET | Freq: Once | ORAL | Status: AC
  Administered 2016-01-19: 1 mg via ORAL

## 2016-01-19 MED ORDER — ONDANSETRON HCL 4 MG PO TABS: 8.0000 mg | ORAL_TABLET | Freq: Once | ORAL | Status: DC

## 2016-01-19 MED ORDER — ONDANSETRON HCL 4 MG PO TABS
4.0000 mg | ORAL_TABLET | Freq: Once | ORAL | Status: AC
  Administered 2016-01-19: 4 mg via ORAL

## 2016-01-19 MED ORDER — ONDANSETRON HCL 4 MG PO TABS
ORAL_TABLET | ORAL | Status: AC
  Filled 2016-01-19: qty 2

## 2016-01-19 NOTE — ED Notes (Signed)

## 2016-01-19 NOTE — ED Notes (Signed)
MD at bedside. 

## 2016-01-19 NOTE — ED Notes (Signed)
Pt transported to MRI by Allayne StackJann, EDT.

## 2016-09-12 ENCOUNTER — Emergency Department: Payer: Medicaid Other

## 2016-09-12 ENCOUNTER — Emergency Department
Admission: EM | Admit: 2016-09-12 | Discharge: 2016-09-12 | Disposition: A | Discharge status: Home / Self Care | Payer: Medicaid Other | Attending: Emergency Medicine | Admitting: Emergency Medicine

## 2016-09-12 ENCOUNTER — Encounter: Payer: Self-pay | Admitting: Emergency Medicine

## 2016-09-12 DIAGNOSIS — F17200 Nicotine dependence, unspecified, uncomplicated: Secondary | ICD-10-CM | POA: Insufficient documentation

## 2016-09-12 DIAGNOSIS — J209 Acute bronchitis, unspecified: Secondary | ICD-10-CM | POA: Diagnosis not present

## 2016-09-12 DIAGNOSIS — Z79899 Other long term (current) drug therapy: Secondary | ICD-10-CM | POA: Insufficient documentation

## 2016-09-12 DIAGNOSIS — R05 Cough: Secondary | ICD-10-CM | POA: Diagnosis present

## 2016-09-12 MED ORDER — ALBUTEROL SULFATE (2.5 MG/3ML) 0.083% IN NEBU
INHALATION_SOLUTION | RESPIRATORY_TRACT | Status: AC
  Administered 2016-09-12: 5 mg via RESPIRATORY_TRACT
  Filled 2016-09-12: qty 3

## 2016-09-12 MED ORDER — ALBUTEROL SULFATE (2.5 MG/3ML) 0.083% IN NEBU
5.0000 mg | INHALATION_SOLUTION | Freq: Once | RESPIRATORY_TRACT | Status: AC
  Administered 2016-09-12: 5 mg via RESPIRATORY_TRACT
  Filled 2016-09-12: qty 6

## 2016-09-12 MED ORDER — MECLIZINE HCL 32 MG PO TABS: 32.0000 mg | ORAL_TABLET | Freq: Three times a day (TID) | ORAL | 0 refills | Status: AC | PRN

## 2016-09-12 MED ORDER — PREDNISONE 10 MG (21) PO TBPK: ORAL_TABLET | Freq: Every day | ORAL | 0 refills | Status: AC

## 2016-09-12 MED ORDER — AZITHROMYCIN 250 MG PO TABS: ORAL_TABLET | ORAL | 0 refills | Status: AC

## 2016-09-12 NOTE — ED Provider Notes (Signed)
[facility] Emergency Department Provider Note  ____________________________________________  Time seen: Approximately 3:49 PM  I have reviewed the triage vital signs and the nursing notes.   HISTORY  Chief Complaint Cough and Bronchitis    HPI Gabriella Coleman is a 46 y.o. female presenting to the emergency department with productive cough for yellow and green sputum, shortness of breath and vertigo for approximately 3 days. Patient denies rhinorrhea, malaise and myalgias. Patient has been afebrile. She denies associated chest pain, chest tightness, nausea, vomiting or abdominal pain. Patient states that she has a history of chronic vertigo treated with meclizine. Patient states that she has tried DayQuil and Robitussin which has not relieved her symptoms.   Past Medical History:  Diagnosis Date  . Bronchitis     There are no active problems to display for this patient.   Past Surgical History:  Procedure Laterality Date  . ABDOMINAL HYSTERECTOMY      Prior to Admission medications   Medication Sig Start Date End Date Taking? Authorizing Provider  albuterol (PROVENTIL HFA;VENTOLIN HFA) 108 (90 BASE) MCG/ACT inhaler Inhale 2 puffs into the lungs every 6 (six) hours as needed for wheezing or shortness of breath. 09/23/14   Triplett, Rulon Eisenmengerari B, FNP  azithromycin (ZITHROMAX Z-PAK) 250 MG tablet Take 2 tablets (500 mg) on  [name] 1,  followed by 1 tablet (250 mg) once daily on Days 2 through 5. 09/12/16 09/17/16  Orvil FeilWoods, Taejah Ohalloran M, PA-C  meclizine (ANTIVERT) 32 MG tablet Take 1 tablet (32 mg total) by mouth 3 (three) times daily as needed. 09/12/16 09/22/16  Orvil FeilWoods, Vanessa Alesi M, PA-C  predniSONE (STERAPRED UNI-PAK 21 TAB) 10 MG (21) TBPK tablet Take by mouth daily. Take 6 tablets the first Espaillat, take 5 tablets the second Lindamood, take 4 tablets the third Sheridan, take 3 tablets the fourth Peart, take 2 tablets the fifth Baumert, take 1 tablet the sixth Stanzione. 09/12/16 09/18/16  Orvil FeilWoods, Kwane Rohl M, PA-C   promethazine-codeine (PHENERGAN WITH CODEINE) 6.25-10 MG/5ML syrup Take 10 mLs by mouth every 8 (eight) hours as needed for cough. 09/23/14   Chinita Pesterriplett, Cari B, FNP    Allergies Percocet [oxycodone-acetaminophen]  History reviewed. No pertinent family history.  Social History Social History  Substance Use Topics  . Smoking status: Current Every Beg[place] Smoker  . Smokeless tobacco: Never Used  . Alcohol use Yes     Review of Systems  Constitutional: No fever/chills Eyes: No visual changes. No discharge ENT: No upper respiratory complaints. Cardiovascular: no chest pain. Respiratory: Patient has productive cough.  Gastrointestinal: No abdominal pain.  No nausea, no vomiting.  No diarrhea.  No constipation. Musculoskeletal: Negative for musculoskeletal pain. Skin: Negative for rash, abrasions, lacerations, ecchymosis. Neurological: Patient has chronic vertigo.    ____________________________________________   PHYSICAL EXAM:  VITAL SIGNS: ED Triage Vitals  Enc Vitals Group     BP 09/12/16 1339 137/74     Pulse Rate 09/12/16 1339 82     Resp 09/12/16 1339 20     Temp 09/12/16 1339 97.8 F (36.6 C)     Temp Source 09/12/16 1339 Oral     SpO2 09/12/16 1339 97 %     Weight 09/12/16 1340 240 lb (108.9 kg)     Height 09/12/16 1340 5\' 6"  (1.676 m)     Head Circumference --      Peak Flow --      Pain Score --      Pain Loc --      Pain  Edu? --      Excl. in GC? --      Constitutional: Alert and oriented. Well appearing and in no acute distress. Eyes: Conjunctivae are normal. PERRL. EOMI. Head: Atraumatic. ENT:      Ears: Tympanic membranes are pearly bilaterally.      Nose: No congestion/rhinnorhea.      Mouth/Throat: Mucous membranes are moist. } Hematological/Lymphatic/Immunilogical: No cervical lymphadenopathy.* Cardiovascular: Normal rate, regular rhythm. Normal S1 and S2.  Good peripheral circulation. Respiratory: Normal respiratory effort without tachypnea or  retractions. Lungs CTAB. Good air entry to the bases with no decreased or absent breath sounds. Musculoskeletal: Full range of motion to all extremities. No gross deformities appreciated. Neurologic:  Normal speech and language. No gross focal neurologic deficits are appreciated.  Skin:  Skin is warm, dry and intact. No rash noted. Psychiatric: Mood and affect are normal. Speech and behavior are normal. Patient exhibits appropriate insight and judgement.   ____________________________________________   LABS (all labs ordered are listed, but only abnormal results are displayed)  Labs Reviewed - No data to display ____________________________________________  EKG   ____________________________________________  RADIOLOGY Geraldo Pitter, personally viewed and evaluated these images (plain radiographs) as part of my medical decision making, as well as reviewing the written report by the radiologist.  Dg Chest 2 View  Result Date: 09/12/2016 CLINICAL DATA:  Cough.  Possible bronchitis. EXAM: CHEST  2 VIEW COMPARISON:  09/23/2014 FINDINGS: The cardiomediastinal silhouette is within normal limits. There is new peribronchial thickening. No confluent airspace opacity, pleural effusion, or pneumothorax is identified. No acute osseous abnormality is seen. IMPRESSION: Airway thickening suggesting bronchitis. Electronically Signed   By: Sebastian Ache M.D.   On: 09/12/2016 14:31    ____________________________________________    PROCEDURES  Procedure(s) performed:    Procedures    Medications  albuterol (PROVENTIL) (2.5 MG/3ML) 0.083% nebulizer solution 5 mg (5 mg Nebulization Given 09/12/16 1353)     ____________________________________________   INITIAL IMPRESSION / ASSESSMENT AND PLAN / ED COURSE  Pertinent labs & imaging results that were available during my care of the patient were reviewed by me and considered in my medical decision making (see chart for details).  Review  of the [place] CSRS was performed in accordance of the [facility] prior to dispensing any controlled drugs.     Assessment and plan: Acute Bronchitis Vertigo Patient presents to the emergency department with cough for purulent sputum production and intermittent shortness of breath. DG chest revealed findings consistent with bronchitis. Physical exam and vital signs are reassuring. Patient was discharged with azithromycin and tapered prednisone. Patient was discharged with meclizine for chronic vertigo. She was advised to follow-up with her primary care provider in one week. All patient questions were answered.   ____________________________________________  FINAL CLINICAL IMPRESSION(S) / ED DIAGNOSES  Final diagnoses:  Acute bronchitis, unspecified organism      NEW MEDICATIONS STARTED DURING THIS VISIT:  New Prescriptions   AZITHROMYCIN (ZITHROMAX Z-PAK) 250 MG TABLET    Take 2 tablets (500 mg) on  Alf 1,  followed by 1 tablet (250 mg) once daily on Days 2 through 5.   MECLIZINE (ANTIVERT) 32 MG TABLET    Take 1 tablet (32 mg total) by mouth 3 (three) times daily as needed.   PREDNISONE (STERAPRED UNI-PAK 21 TAB) 10 MG (21) TBPK TABLET    Take by mouth daily. Take 6 tablets the first Janik, take 5 tablets the second Divis, take 4 tablets the third Sikora, take 3  tablets the fourth Pacer, take 2 tablets the fifth Lampley, take 1 tablet the sixth Limehouse.        This chart was dictated using voice recognition software/Dragon. Despite best efforts to proofread, errors can occur which can change the meaning. Any change was purely unintentional.    Orvil Feil, PA-C 09/12/16 1559    Rockne Menghini, MD 09/12/16 [phone]

## 2016-09-12 NOTE — ED Triage Notes (Signed)
Pt presents to ED c/o bronchitis with raspy productive cough at night x3 days. Pt has taken dayquil , and robitussin DM without relief.

## 2016-09-26 ENCOUNTER — Other Ambulatory Visit: Payer: Self-pay | Admitting: Family Medicine

## 2016-09-26 DIAGNOSIS — Z1231 Encounter for screening mammogram for malignant neoplasm of breast: Secondary | ICD-10-CM

## 2016-10-27 ENCOUNTER — Ambulatory Visit
Admission: RE | Admit: 2016-10-27 | Discharge: 2016-10-27 | Disposition: A | Discharge status: Home / Self Care | Payer: Medicaid Other | Source: Ambulatory Visit | Attending: Family Medicine | Admitting: Family Medicine

## 2016-10-27 DIAGNOSIS — Z1231 Encounter for screening mammogram for malignant neoplasm of breast: Secondary | ICD-10-CM | POA: Diagnosis present

## 2017-11-08 ENCOUNTER — Other Ambulatory Visit: Payer: Self-pay | Admitting: Family Medicine

## 2017-11-08 DIAGNOSIS — Z1231 Encounter for screening mammogram for malignant neoplasm of breast: Secondary | ICD-10-CM

## 2017-12-19 ENCOUNTER — Ambulatory Visit
Admission: RE | Admit: 2017-12-19 | Discharge: 2017-12-19 | Disposition: A | Discharge status: Home / Self Care | Payer: Medicaid Other | Source: Ambulatory Visit | Attending: Family Medicine | Admitting: Family Medicine

## 2017-12-19 DIAGNOSIS — Z1231 Encounter for screening mammogram for malignant neoplasm of breast: Secondary | ICD-10-CM | POA: Insufficient documentation

## 2020-03-31 ENCOUNTER — Other Ambulatory Visit: Payer: Self-pay | Admitting: Physician Assistant

## 2020-03-31 DIAGNOSIS — Z1231 Encounter for screening mammogram for malignant neoplasm of breast: Secondary | ICD-10-CM

## 2021-02-19 ENCOUNTER — Emergency Department
Admission: EM | Admit: 2021-02-19 | Discharge: 2021-02-19 | Disposition: A | Discharge status: Home / Self Care | Payer: Medicaid Other | Attending: Emergency Medicine | Admitting: Emergency Medicine

## 2021-02-19 ENCOUNTER — Other Ambulatory Visit: Payer: Self-pay

## 2021-02-19 DIAGNOSIS — W1839XA Other fall on same level, initial encounter: Secondary | ICD-10-CM | POA: Diagnosis not present

## 2021-02-19 DIAGNOSIS — S161XXA Strain of muscle, fascia and tendon at neck level, initial encounter: Secondary | ICD-10-CM | POA: Diagnosis not present

## 2021-02-19 DIAGNOSIS — F17200 Nicotine dependence, unspecified, uncomplicated: Secondary | ICD-10-CM | POA: Diagnosis not present

## 2021-02-19 DIAGNOSIS — W19XXXA Unspecified fall, initial encounter: Secondary | ICD-10-CM

## 2021-02-19 DIAGNOSIS — S199XXA Unspecified injury of neck, initial encounter: Secondary | ICD-10-CM | POA: Diagnosis present

## 2021-02-19 MED ORDER — LIDOCAINE 5 % EX PTCH
1.0000 | MEDICATED_PATCH | CUTANEOUS | Status: DC
  Administered 2021-02-19: 1 via TRANSDERMAL
  Filled 2021-02-19: qty 1

## 2021-02-19 MED ORDER — CYCLOBENZAPRINE HCL 5 MG PO TABS: 5.0000 mg | ORAL_TABLET | Freq: Three times a day (TID) | ORAL | 0 refills | Status: AC | PRN

## 2021-02-19 MED ORDER — KETOROLAC TROMETHAMINE 30 MG/ML IJ SOLN
30.0000 mg | Freq: Once | INTRAMUSCULAR | Status: AC
  Administered 2021-02-19: 30 mg via INTRAMUSCULAR
  Filled 2021-02-19: qty 1

## 2021-02-19 MED ORDER — LIDOCAINE 5 % EX PTCH: 1.0000 | MEDICATED_PATCH | Freq: Two times a day (BID) | CUTANEOUS | 0 refills | Status: AC

## 2021-02-19 NOTE — ED Triage Notes (Signed)
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 States she has talking to beatrice who is the [employer]

## 2021-02-19 NOTE — Discharge Instructions (Signed)
Take your anti-inflammatory for your arthritis, Tylenol 1 g every 8 hours and use the lidocaine patches and the Flexeril to help with muscle spasms.  The Flexeril can make you tired and sometimes dizzy so try it first taking it at nighttime.  Do not drive or work while on this

## 2021-02-19 NOTE — ED Provider Notes (Signed)
[facility] Emergency Department Provider Note  ____________________________________________   Event Date/Time   First MD Initiated Contact with Patient 02/19/21 1026     (approximate)  I have reviewed the triage vital signs and the nursing notes.   HISTORY  Chief Complaint Fall    HPI Gabriella Coleman is a 50 y.o. 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  Patient states that she did hit the side of her head but denies any hematoma or significant pain to that area.  She states that initially it did hurt a tiny bit but she put some ice on it and has not gone gone away.  She denies any confusion, vomiting, being on any blood thinners.  She denies any LOC.  She states that she feels at her baseline self.  She denies any midline neck tenderness.  She reports having pain on the right side of her neck going down into her shoulder.  The pain is worse when she tries to move her neck but she is able to still have full range of motion.  She denies any numbness or tingling in her hands.  Reports a clicking sensation in her left thumb but denies any tenderness.  She took some Tylenol and is already on an anti-inflammatory for baseline arthritis.  Denies any chest wall tenderness, abdominal tenderness or other injuries she has been ambulatory since it happened.          Past Medical History:  Diagnosis Date   Bronchitis     There are no problems to display for this patient.   Past Surgical History:  Procedure Laterality Date   ABDOMINAL HYSTERECTOMY     BREAST BIOPSY Left 04/2010   neg- Tophat clip    Prior to Admission medications   Medication Sig Start Date End Date Taking? Authorizing Provider  albuterol (PROVENTIL HFA;VENTOLIN HFA) 108 (90 BASE) MCG/ACT inhaler Inhale 2 puffs into the lungs every 6 (six) hours as needed for wheezing or shortness of breath. 09/23/14   Triplett, Cari B, FNP   promethazine-codeine (PHENERGAN WITH CODEINE) 6.25-10 MG/5ML syrup Take 10 mLs by mouth every 8 (eight) hours as needed for cough. 09/23/14   Chinita Pester, FNP    Allergies Percocet [oxycodone-acetaminophen]  Family History  Problem Relation Age of Onset   Breast cancer Neg Hx     Social History Social History   Tobacco Use   Smoking status: Every Ask   Smokeless tobacco: Never  Substance Use Topics   Alcohol use: Yes   Drug use: No      Review of Systems Constitutional: No fever/chills Eyes: No visual changes. ENT: No sore throat.  Hit her head Cardiovascular: Denies chest pain. Respiratory: Denies shortness of breath. Gastrointestinal: No abdominal pain.  No nausea, no vomiting.  No diarrhea.  No constipation. Genitourinary: Negative for dysuria. Musculoskeletal: Right neck pain, left thumb clicking Skin: Negative for rash. Neurological: Negative for headaches, focal weakness or numbness. All other ROS negative ____________________________________________   PHYSICAL EXAM:  VITAL SIGNS: ED Triage Vitals  Enc Vitals Group     BP 02/19/21 1001 (!) 148/94     Pulse Rate 02/19/21 1001 73     Resp 02/19/21 1001 18     Temp 02/19/21 1001 97.9 F (36.6 C)     Temp Source 02/19/21 1001 Oral     SpO2 02/19/21 1001 98 %  Weight 02/19/21 1002 240 lb (108.9 kg)     Height 02/19/21 1002 5\' 6"  (1.676 m)     Head Circumference --      Peak Flow --      Pain Score 02/19/21 1001 10     Pain Loc --      Pain Edu? --      Excl. in GC? --     Constitutional: Alert and oriented. Well appearing and in no acute distress. Eyes: Conjunctivae are normal. EOMI. Head: Atraumatic.  No battle sign, raccoon eyes, TMs clear bilaterally Nose: No congestion/rhinnorhea. Mouth/Throat: Mucous membranes are moist.   Neck: No stridor. Trachea Midline. FROM Cardiovascular: Normal rate, regular rhythm. Grossly normal heart sounds.  Good peripheral circulation.  No chest wall  tenderness Respiratory: Normal respiratory effort.  No retractions. Lungs CTAB. Gastrointestinal: Soft and nontender. No distention. No abdominal bruits.  Musculoskeletal: No lower extremity tenderness nor edema.  No joint effusions.  No snuffbox tenderness.  Sensation intact.  2+ distal pulse in the hand.  No bruising.  When she goes to extend her left thumb there is a clicking noise.  She still able to range it fully. Neurologic:  Normal speech and language. No gross focal neurologic deficits are appreciated.  Skin:  Skin is warm, dry and intact. No rash noted. Psychiatric: Mood and affect are normal. Speech and behavior are normal. GU: Deferred  Tenderness along the right side of her neck about 2-3 inches away from midline down into her shoulder .  No midline C-spine tenderness.  Full range of motion of neck.  No numbness or tingling in the hands ____________________________________________  INITIAL IMPRESSION / ASSESSMENT AND PLAN / ED COURSE  Gabriella Coleman was evaluated in Emergency Department on 02/19/2021 for the symptoms described in the history of present illness. She was evaluated in the context of the global COVID-19 pandemic, which necessitated consideration that the patient might be at risk for infection with the SARS-CoV-2 virus that causes COVID-19. Institutional protocols and algorithms that pertain to the evaluation of patients at risk for COVID-19 are in a state of rapid change based on information released by regulatory bodies including the CDC and federal and state organizations. These policies and algorithms were followed during the patient's care in the ED.     Patient comes in with a mechanical fall that happened yesterday.  Patient is very well-appearing with reassuring vital signs.  Her biggest concern is some right sided neck pain going down into her shoulder.  Patient did hit her head but this occurred yesterday.  Her getting CT head is completely negative and I discussed this  with patient and she is comfortable holding off on CT imaging but will return if she develops any worsening symptoms.  Patient has no midline neck tenderness and per Nexus criteria she also does not need any CT imaging of her neck.  Her pain runs along the trapezius muscle.  She got no numbness or tingling, full range of motion of neck and no midline tenderness to suggest needing CT imaging.  For her left thumb clicking I suspect that this is some kind of tendon issue.  She is not tender to suggest a scaphoid fracture.  Offered x-ray but patient declined stating that she has follow-up with Dr. 02/21/2021 next week and she will just talk to him about it.  We discussed anti-inflammatories which she is already on, Tylenol, lidocaine patches and some Flexeril to help at nighttime but that it can cause some  drowsiness.  Patient does not go back to work until Monday so we will write her out until Tuesday so that she can follow-up with a primary care doctor on Monday given she does not work today, tomorrow, Sunday.        ____________________________________________   FINAL CLINICAL IMPRESSION(S) / ED DIAGNOSES   Final diagnoses:  Fall, initial encounter  Neck strain, initial encounter      MEDICATIONS GIVEN DURING THIS VISIT:  Medications  lidocaine (LIDODERM) 5 % 1 patch (1 patch Transdermal Patch Applied 02/19/21 1041)  ketorolac (TORADOL) 30 MG/ML injection 30 mg (30 mg Intramuscular Given 02/19/21 1041)     ED Discharge Orders          Ordered    lidocaine (LIDODERM) 5 %  Every 12 hours        02/19/21 1045    cyclobenzaprine (FLEXERIL) 5 MG tablet  3 times daily PRN        10 /14/22 1045             Note:  This document was prepared using Dragon voice recognition software and may include unintentional dictation errors.    05-11-1972, MD 02/19/21 1046

## 2021-02-26 ENCOUNTER — Other Ambulatory Visit: Payer: Self-pay | Admitting: Family Medicine

## 2021-02-26 ENCOUNTER — Ambulatory Visit
Admission: RE | Admit: 2021-02-26 | Discharge: 2021-02-26 | Disposition: A | Discharge status: Home / Self Care | Payer: Self-pay | Source: Ambulatory Visit | Attending: Family Medicine | Admitting: Family Medicine

## 2021-02-26 DIAGNOSIS — W19XXXA Unspecified fall, initial encounter: Secondary | ICD-10-CM

## 2021-03-02 ENCOUNTER — Other Ambulatory Visit: Payer: Self-pay | Admitting: Physician Assistant

## 2021-03-02 DIAGNOSIS — Z1231 Encounter for screening mammogram for malignant neoplasm of breast: Secondary | ICD-10-CM

## 2021-03-15 ENCOUNTER — Emergency Department
Admission: EM | Admit: 2021-03-15 | Discharge: 2021-03-15 | Disposition: A | Discharge status: Home / Self Care | Payer: Medicaid Other | Attending: Emergency Medicine | Admitting: Emergency Medicine

## 2021-03-15 ENCOUNTER — Encounter: Payer: Self-pay | Admitting: Emergency Medicine

## 2021-03-15 ENCOUNTER — Emergency Department: Payer: Medicaid Other

## 2021-03-15 ENCOUNTER — Other Ambulatory Visit: Payer: Self-pay

## 2021-03-15 DIAGNOSIS — Z20822 Contact with and (suspected) exposure to covid-19: Secondary | ICD-10-CM | POA: Insufficient documentation

## 2021-03-15 DIAGNOSIS — F17200 Nicotine dependence, unspecified, uncomplicated: Secondary | ICD-10-CM | POA: Insufficient documentation

## 2021-03-15 DIAGNOSIS — J209 Acute bronchitis, unspecified: Secondary | ICD-10-CM | POA: Insufficient documentation

## 2021-03-15 DIAGNOSIS — R059 Cough, unspecified: Secondary | ICD-10-CM | POA: Diagnosis present

## 2021-03-15 LAB — RESP PANEL BY RT-PCR (FLU A&B, COVID) ARPGX2
Influenza A by PCR: NEGATIVE
Influenza B by PCR: NEGATIVE
SARS Coronavirus 2 by RT PCR: NEGATIVE

## 2021-03-15 MED ORDER — IPRATROPIUM-ALBUTEROL 0.5-2.5 (3) MG/3ML IN SOLN
3.0000 mL | Freq: Once | RESPIRATORY_TRACT | Status: AC
  Administered 2021-03-15: 3 mL via RESPIRATORY_TRACT

## 2021-03-15 MED ORDER — IPRATROPIUM-ALBUTEROL 0.5-2.5 (3) MG/3ML IN SOLN
3.0000 mL | Freq: Once | RESPIRATORY_TRACT | Status: AC
  Filled 2021-03-15: qty 3

## 2021-03-15 MED ORDER — IPRATROPIUM-ALBUTEROL 0.5-2.5 (3) MG/3ML IN SOLN: 3.0000 mL | RESPIRATORY_TRACT | 3 refills | Status: DC | PRN

## 2021-03-15 MED ORDER — AZITHROMYCIN 250 MG PO TABS: ORAL_TABLET | ORAL | 0 refills | Status: DC

## 2021-03-15 MED ORDER — AMOXICILLIN-POT CLAVULANATE 875-125 MG PO TABS
1.0000 | ORAL_TABLET | Freq: Once | ORAL | Status: AC
  Administered 2021-03-15: 1 via ORAL
  Filled 2021-03-15: qty 1

## 2021-03-15 MED ORDER — IPRATROPIUM-ALBUTEROL 0.5-2.5 (3) MG/3ML IN SOLN
RESPIRATORY_TRACT | Status: AC
  Administered 2021-03-15: 3 mL via RESPIRATORY_TRACT
  Filled 2021-03-15: qty 9

## 2021-03-15 MED ORDER — ALBUTEROL SULFATE HFA 108 (90 BASE) MCG/ACT IN AERS: 2.0000 | INHALATION_SPRAY | Freq: Four times a day (QID) | RESPIRATORY_TRACT | 2 refills | Status: DC | PRN

## 2021-03-15 MED ORDER — PREDNISONE 10 MG (21) PO TBPK: ORAL_TABLET | ORAL | 0 refills | Status: DC

## 2021-03-15 NOTE — ED Provider Notes (Signed)
[facility] Emergency Department Provider Note  ____________________________________________   Event Date/Time   First MD Initiated Contact with Patient 03/15/21 0801     (approximate)  I have reviewed the triage vital signs and the nursing notes.   HISTORY  Chief Complaint Cough    HPI Gabriella Coleman is a 50 y.o. female presents emergency department with cough for 1 week.  Patient states she has been using over-the-counter medications without relief.  She denies shortness of breath but feels like she has chest pressure.  She continues to cough when she is lying down.  Has not gotten any rest.  States this happens frequently and she notices bronchitis.  No fever, vomiting, diarrhea  Past Medical History:  Diagnosis Date   Bronchitis     There are no problems to display for this patient.   Past Surgical History:  Procedure Laterality Date   ABDOMINAL HYSTERECTOMY     BREAST BIOPSY Left 04/2010   neg- Tophat clip    Prior to Admission medications   Medication Sig Start Date End Date Taking? Authorizing Provider  albuterol (VENTOLIN HFA) 108 (90 Base) MCG/ACT inhaler Inhale 2 puffs into the lungs every 6 (six) hours as needed for wheezing or shortness of breath. 03/15/21  Yes Corry Ihnen, Roselyn Bering, PA-C  azithromycin (ZITHROMAX Z-PAK) 250 MG tablet 2 pills today then 1 pill a [place] for 4 days 03/15/21  Yes Chaquetta Schlottman, Roselyn Bering, PA-C  ipratropium-albuterol (DUONEB) 0.5-2.5 (3) MG/3ML SOLN Take 3 mLs by nebulization every 4 (four) hours as needed. 03/15/21  Yes Adriane Guglielmo, Roselyn Bering, PA-C  predniSONE (STERAPRED UNI-PAK 21 TAB) 10 MG (21) TBPK tablet Take 6 pills on [place] one then decrease by 1 pill each [place] 03/15/21  Yes Tal Neer, Roselyn Bering, PA-C    Allergies Percocet [oxycodone-acetaminophen]  Family History  Problem Relation Age of Onset   Breast cancer Neg Hx     Social History Social History   Tobacco Use   Smoking status: Every [place]   Smokeless tobacco: Never   Substance Use Topics   Alcohol use: Yes   Drug use: No    Review of Systems  Constitutional: No fever/chills Eyes: No visual changes. ENT: No sore throat. Respiratory: Positive cough Cardiovascular: Denies chest pain Gastrointestinal: Denies abdominal pain Genitourinary: Negative for dysuria. Musculoskeletal: Negative for back pain. Skin: Negative for rash. Psychiatric: no mood changes,     ____________________________________________   PHYSICAL EXAM:  VITAL SIGNS: ED Triage Vitals [03/15/21 0346]  Enc Vitals Group     BP 135/83     Pulse Rate 89     Resp 20     Temp 98.4 F (36.9 C)     Temp Source Oral     SpO2 98 %     Weight      Height      Head Circumference      Peak Flow      Pain Score      Pain Loc      Pain Edu?      Excl. in GC?     Constitutional: Alert and oriented. Well appearing and in no acute distress. Eyes: Conjunctivae are normal.  Head: Atraumatic. Nose: No congestion/rhinnorhea. Mouth/Throat: Mucous membranes are moist.   Neck:  supple no lymphadenopathy noted Cardiovascular: Normal rate, regular rhythm. Heart sounds are normal Respiratory: Normal respiratory effort.  No retractions, lungs c t a  Abd: soft nontender bs normal all 4 quad GU: deferred Musculoskeletal: FROM all extremities, warm  and well perfused Neurologic:  Normal speech and language.  Skin:  Skin is warm, dry and intact. No rash noted. Psychiatric: Mood and affect are normal. Speech and behavior are normal.  ____________________________________________   LABS (all labs ordered are listed, but only abnormal results are displayed)  Labs Reviewed  RESP PANEL BY RT-PCR (FLU A&B, COVID) ARPGX2   ____________________________________________   ____________________________________________  RADIOLOGY  Chest x-ray  ____________________________________________   PROCEDURES  Procedure(s) performed:  No  Procedures    ____________________________________________   INITIAL IMPRESSION / ASSESSMENT AND PLAN / ED COURSE  Pertinent labs & imaging results that were available during my care of the patient were reviewed by me and considered in my medical decision making (see chart for details).   The patient is a 50 year old female presents with bronchitis symptoms.  See HPI.  Physical exam shows patient be stable  Respiratory panel is negative for COVID and influenza  Chest x-ray reviewed by me confirmed by radiology to be negative for pneumonia  I did explain the findings to the patient.  Her last DuoNeb was at 4 AM so we will give her an additional prior to discharge.  She will be given a prescription for a nebulizer machine, DuoNeb Nebules, albuterol inhaler, Sterapred, and Zithromax.  She is to follow-up with her regular doctor if not improving to 3 days.  Return emergency department worsening.     Gabriella Coleman was evaluated in Emergency Department on 03/15/2021 for the symptoms described in the history of present illness. She was evaluated in the context of the global COVID-19 pandemic, which necessitated consideration that the patient might be at risk for infection with the SARS-CoV-2 virus that causes COVID-19. Institutional protocols and algorithms that pertain to the evaluation of patients at risk for COVID-19 are in a state of rapid change based on information released by regulatory bodies including the CDC and federal and state organizations. These policies and algorithms were followed during the patient's care in the ED.    As part of my medical decision making, I reviewed the following data within the electronic MEDICAL RECORD NUMBER Nursing notes reviewed and incorporated, Labs reviewed , Old chart reviewed, Radiograph reviewed , Notes from prior ED visits, and [place] Controlled Substance Database  ____________________________________________   FINAL CLINICAL IMPRESSION(S) / ED  DIAGNOSES  Final diagnoses:  Acute bronchitis, unspecified organism      NEW MEDICATIONS STARTED DURING THIS VISIT:  New Prescriptions   ALBUTEROL (VENTOLIN HFA) 108 (90 BASE) MCG/ACT INHALER    Inhale 2 puffs into the lungs every 6 (six) hours as needed for wheezing or shortness of breath.   AZITHROMYCIN (ZITHROMAX Z-PAK) 250 MG TABLET    2 pills today then 1 pill a [place] for 4 days   IPRATROPIUM-ALBUTEROL (DUONEB) 0.5-2.5 (3) MG/3ML SOLN    Take 3 mLs by nebulization every 4 (four) hours as needed.   PREDNISONE (STERAPRED UNI-PAK 21 TAB) 10 MG (21) TBPK TABLET    Take 6 pills on [place] one then decrease by 1 pill each [place]     Note:  This document was prepared using Dragon voice recognition software and may include unintentional dictation errors.    Faythe Ghee, PA-C 03/15/21 5750    Chesley Noon, MD 03/15/21 [id number]

## 2021-03-15 NOTE — Discharge Instructions (Signed)
Follow-up with your regular doctor if not improving to 3 days.  Return emergency department worsening.  Use medication as prescribed. 

## 2021-03-15 NOTE — ED Triage Notes (Signed)
Pt c/o cough x1 week without relief of OTC medications. Pt denies SOB but sts she feels she cant breath when she is lying down due to continued coughing.

## 2021-03-19 ENCOUNTER — Other Ambulatory Visit: Payer: Self-pay | Admitting: Physician Assistant

## 2021-03-19 DIAGNOSIS — Z1231 Encounter for screening mammogram for malignant neoplasm of breast: Secondary | ICD-10-CM

## 2021-04-02 ENCOUNTER — Emergency Department: Payer: Medicaid Other

## 2021-04-02 ENCOUNTER — Other Ambulatory Visit: Payer: Self-pay

## 2021-04-02 ENCOUNTER — Emergency Department
Admission: EM | Admit: 2021-04-02 | Discharge: 2021-04-02 | Discharge status: Against Medical Advice | Payer: Medicaid Other | Attending: Emergency Medicine | Admitting: Emergency Medicine

## 2021-04-02 DIAGNOSIS — M549 Dorsalgia, unspecified: Secondary | ICD-10-CM | POA: Diagnosis not present

## 2021-04-02 DIAGNOSIS — J40 Bronchitis, not specified as acute or chronic: Secondary | ICD-10-CM | POA: Insufficient documentation

## 2021-04-02 DIAGNOSIS — R059 Cough, unspecified: Secondary | ICD-10-CM | POA: Diagnosis present

## 2021-04-02 DIAGNOSIS — F17200 Nicotine dependence, unspecified, uncomplicated: Secondary | ICD-10-CM | POA: Insufficient documentation

## 2021-04-02 DIAGNOSIS — Z20822 Contact with and (suspected) exposure to covid-19: Secondary | ICD-10-CM | POA: Insufficient documentation

## 2021-04-02 DIAGNOSIS — I10 Essential (primary) hypertension: Secondary | ICD-10-CM | POA: Insufficient documentation

## 2021-04-02 LAB — RESP PANEL BY RT-PCR (FLU A&B, COVID) ARPGX2
Influenza A by PCR: NEGATIVE
Influenza B by PCR: NEGATIVE
SARS Coronavirus 2 by RT PCR: NEGATIVE

## 2021-04-02 MED ORDER — DOXYCYCLINE HYCLATE 100 MG PO CAPS: 100.0000 mg | ORAL_CAPSULE | Freq: Two times a day (BID) | ORAL | 0 refills | Status: AC

## 2021-04-02 MED ORDER — ALBUTEROL SULFATE HFA 108 (90 BASE) MCG/ACT IN AERS: 2.0000 | INHALATION_SPRAY | Freq: Four times a day (QID) | RESPIRATORY_TRACT | 2 refills | Status: AC | PRN

## 2021-04-02 MED ORDER — KETOROLAC TROMETHAMINE 60 MG/2ML IM SOLN
60.0000 mg | Freq: Once | INTRAMUSCULAR | Status: AC
  Administered 2021-04-02: 60 mg via INTRAMUSCULAR
  Filled 2021-04-02: qty 2

## 2021-04-02 MED ORDER — IPRATROPIUM-ALBUTEROL 0.5-2.5 (3) MG/3ML IN SOLN
3.0000 mL | Freq: Once | RESPIRATORY_TRACT | Status: AC
  Administered 2021-04-02: 3 mL via RESPIRATORY_TRACT
  Filled 2021-04-02: qty 3

## 2021-04-02 MED ORDER — IPRATROPIUM-ALBUTEROL 0.5-2.5 (3) MG/3ML IN SOLN: 3.0000 mL | RESPIRATORY_TRACT | 3 refills | Status: AC | PRN

## 2021-04-02 MED ORDER — PREDNISONE 10 MG (21) PO TBPK: ORAL_TABLET | ORAL | 0 refills | Status: AC

## 2021-04-02 MED ORDER — PREDNISONE 20 MG PO TABS
60.0000 mg | ORAL_TABLET | Freq: Once | ORAL | Status: AC
  Administered 2021-04-02: 60 mg via ORAL
  Filled 2021-04-02: qty 3

## 2021-04-02 NOTE — ED Provider Notes (Signed)
[facility] Emergency Department Provider Note  ____________________________________________   None    (approximate)  I have reviewed the triage vital signs and the nursing notes.   HISTORY  Chief Complaint Cough (Pt c/o cough. Pt reports that she has bronchitis and all she needs is breathing trx, Z-pak & something for a cough)   HPI Gabriella Coleman is a 50 y.o. female with a past medical history of recurrent bronchitis who presents for assessment of 3 to 4 days of some cough, sore throat and back pain.  She denies any significant shortness of breath, chest pain, fevers, headache, earache, sore throat, vomiting, diarrhea, burning with urination, rash or extremity pain.  No recent falls or injuries.  She denies tobacco abuse or illicit drug use.  States she was seen for similar symptoms earlier this month and improved significantly with albuterol steroids and azithromycin and feels that she needs to have another course of this.  No other acute concerns at this time.         Past Medical History:  Diagnosis Date   Bronchitis     There are no problems to display for this patient.   Past Surgical History:  Procedure Laterality Date   ABDOMINAL HYSTERECTOMY     BREAST BIOPSY Left 04/2010   neg- Tophat clip    Prior to Admission medications   Medication Sig Start Date End Date Taking? Authorizing Provider  doxycycline (VIBRAMYCIN) 100 MG capsule Take 1 capsule (100 mg total) by mouth 2 (two) times daily for 7 days. 04/02/21 04/09/21 Yes Gilles Chiquito, MD  albuterol (VENTOLIN HFA) 108 (90 Base) MCG/ACT inhaler Inhale 2 puffs into the lungs every 6 (six) hours as needed for wheezing or shortness of breath. 04/02/21   Gilles Chiquito, MD  ipratropium-albuterol (DUONEB) 0.5-2.5 (3) MG/3ML SOLN Take 3 mLs by nebulization every 4 (four) hours as needed. 04/02/21   Gilles Chiquito, MD  predniSONE (STERAPRED UNI-PAK 21 TAB) 10 MG (21) TBPK tablet Take 6 pills on  [place] one then decrease by 1 pill each [place] 04/02/21   Gilles Chiquito, MD    Allergies Percocet [oxycodone-acetaminophen]  Family History  Problem Relation Age of Onset   Breast cancer Neg Hx     Social History Social History   Tobacco Use   Smoking status: Every [place]   Smokeless tobacco: Never  Substance Use Topics   Alcohol use: Yes   Drug use: No    Review of Systems  Review of Systems  Constitutional:  Negative for chills and fever.  HENT:  Positive for sore throat.   Eyes:  Negative for pain.  Respiratory:  Positive for cough, shortness of breath and wheezing. Negative for stridor.   Cardiovascular:  Negative for chest pain.  Gastrointestinal:  Negative for vomiting.  Musculoskeletal:  Positive for neck pain.  Skin:  Negative for rash.  Neurological:  Negative for seizures, loss of consciousness and headaches.  Psychiatric/Behavioral:  Negative for suicidal ideas.   All other systems reviewed and are negative.    ____________________________________________   PHYSICAL EXAM:  VITAL SIGNS: ED Triage Vitals  Enc Vitals Group     BP 04/02/21 0038 (!) 183/73     Pulse Rate 04/02/21 0038 99     Resp 04/02/21 0038 18     Temp 04/02/21 0038 97.7 F (36.5 C)     Temp Source 04/02/21 0038 Oral     SpO2 04/02/21 0038 98 %     Weight  04/02/21 0040 239 lb 13.8 oz (108.8 kg)     Height 04/02/21 0040 5\' 6"  (1.676 m)     Head Circumference --      Peak Flow --      Pain Score 04/02/21 0040 10     Pain Loc --      Pain Edu? --      Excl. in GC? --    Vitals:   04/02/21 0038 04/02/21 0629  BP: (!) 183/73 (!) 141/78  Pulse: 99 77  Resp: 18 20  Temp: 97.7 F (36.5 C)   SpO2: 98% 97%   Physical Exam Vitals and nursing note reviewed.  Constitutional:      General: She is not in acute distress.    Appearance: She is well-developed.  HENT:     Head: Normocephalic and atraumatic.     Right Ear: External ear normal.     Left Ear: External ear normal.     Nose:  Nose normal.  Eyes:     Conjunctiva/sclera: Conjunctivae normal.  Cardiovascular:     Rate and Rhythm: Normal rate and regular rhythm.     Heart sounds: No murmur heard. Pulmonary:     Effort: Pulmonary effort is normal. No respiratory distress.     Breath sounds: Normal breath sounds.  Abdominal:     Palpations: Abdomen is soft.     Tenderness: There is no abdominal tenderness.  Musculoskeletal:        General: No swelling.     Cervical back: Neck supple.  Skin:    General: Skin is warm and dry.     Capillary Refill: Capillary refill takes less than 2 seconds.  Neurological:     Mental Status: She is alert and oriented to person, place, and time.  Psychiatric:        Mood and Affect: Mood normal.     ____________________________________________   LABS (all labs ordered are listed, but only abnormal results are displayed)  Labs Reviewed  RESP PANEL BY RT-PCR (FLU A&B, COVID) ARPGX2   ____________________________________________  EKG  ECG shows sinus rhythm with a ventricular rate of 78, normal axis, unremarkable intervals without evidence of acute ischemia or significant arrhythmia. ____________________________________________  RADIOLOGY  ED MD interpretation: Chest x-ray shows no focal consolidation, effusion, edema, pneumothorax or any other clear acute thoracic process.  Official radiology report(s): DG Chest 2 View  Result Date: 04/02/2021 CLINICAL DATA:  Cough EXAM: CHEST - 2 VIEW COMPARISON:  Chest x-ray 03/15/2021, CT chest 11/18/2008 FINDINGS: The heart and mediastinal contours are within normal limits. Left base atelectasis. No focal consolidation. No pulmonary edema. No pleural effusion. No pneumothorax. No acute osseous abnormality. IMPRESSION: No active cardiopulmonary disease. Electronically Signed   By: 11/20/2008 M.D.   On: 04/02/2021 01:22    ____________________________________________   PROCEDURES  Procedure(s) performed (including Critical  Care):  Procedures   ____________________________________________   INITIAL IMPRESSION / ASSESSMENT AND PLAN / ED COURSE      Patient presents with above-stated history exam for assessment of 3 to 4 days of cough and congestion associate with sore throat and mid upper back pain.  On arrival she is hypertensive with otherwise stable vital signs on room air.  Her lungs are clear bilaterally.  Oropharynx with some erythema but otherwise unremarkable.  No significant wheezing on exam.  After discussion patient review of records it is somewhat unclear if she actually has a history of any obstructive airway disease although she has been prescribed albuterol  several times and states this typically improves her breathing when she is having a bronchitis flare.  While you are here significant wheezing now and she does not seem in significant respite distress I think it is reasonable to give her a refill for this.  I do not believe she has a bacterial pneumonia as she has no fever, abnormal breath sounds or focal consolidations on her chest x-ray.  There is no evidence on chest x-ray of CHF and she does not appear volume overloaded.  Discussed at length with the patient concern for possible PE given her back pain recommendation to obtain a D-dimer patient is adamantly refusing this at this time stating she wishes to go home and try steroids and antibiotics will come back if he is any worse.  I discussed this with pulmonary medicine can be life-threatening and I recommend commended additional blood work and possibly a CT of the chest but she declines this at this time.  I think he has capacity to make this decision and is able to voice back the wrist to me.  EKG without evidence of clear acute ischemia.  Patient given a dose of Toradol and DuoNeb's emergency room.  She states her breathing felt little better after DuoNeb's.  EKG without evidence of ischemia and I have low suspicion t for atypical presentation for  ACS arrhythmia.  Low suspicion for dissection at this time.  Discharged against my advice refusing further PE work-up with Rx for doxycycline, prednisone and albuterol.  Advised elevated blood pressures noted in emergency room and recommendation to have this followed up by PCP.        ____________________________________________   FINAL CLINICAL IMPRESSION(S) / ED DIAGNOSES  Final diagnoses:  Bronchitis  Hypertension, unspecified type    Medications  predniSONE (DELTASONE) tablet 60 mg (has no administration in time range)  ipratropium-albuterol (DUONEB) 0.5-2.5 (3) MG/3ML nebulizer solution 3 mL (3 mLs Nebulization Given 04/02/21 0645)  ketorolac (TORADOL) injection 60 mg (60 mg Intramuscular Given 04/02/21 0852)     ED Discharge Orders          Ordered    albuterol (VENTOLIN HFA) 108 (90 Base) MCG/ACT inhaler  Every 6 hours PRN        04/02/21 0848    predniSONE (STERAPRED UNI-PAK 21 TAB) 10 MG (21) TBPK tablet        04/02/21 0848    ipratropium-albuterol (DUONEB) 0.5-2.5 (3) MG/3ML SOLN  Every 4 hours PRN        04/02/21 0848    doxycycline (VIBRAMYCIN) 100 MG capsule  2 times daily        04/02/21 0848             Note:  This document was prepared using Dragon voice recognition software and may include unintentional dictation errors.    Gilles Chiquito, MD 04/02/21 0900

## 2021-04-02 NOTE — ED Triage Notes (Signed)
Pt c/o cough. Pt reports that she has bronchitis and all she needs is breathing trx, Z-pak & something for a cough

## 2021-04-21 DIAGNOSIS — Z1231 Encounter for screening mammogram for malignant neoplasm of breast: Secondary | ICD-10-CM
